# Patient Record
Sex: Male | Born: 1995 | Race: White | Hispanic: No | Marital: Single | Smoking: Current some day smoker
Health system: Southern US, Community
[De-identification: ages and names within clinical notes are randomized; demographics above are authoritative.]

## PROBLEM LIST (undated history)

## (undated) DIAGNOSIS — G809 Cerebral palsy, unspecified: Secondary | ICD-10-CM

## (undated) HISTORY — PX: LEG SURGERY: SHX1003

---
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
## 2019-07-12 ENCOUNTER — Ambulatory Visit: Payer: Self-pay | Admitting: Physician Assistant

## 2019-07-12 ENCOUNTER — Other Ambulatory Visit: Payer: Self-pay

## 2019-07-12 DIAGNOSIS — Z202 Contact with and (suspected) exposure to infections with a predominantly sexual mode of transmission: Secondary | ICD-10-CM

## 2019-07-12 DIAGNOSIS — Z113 Encounter for screening for infections with a predominantly sexual mode of transmission: Secondary | ICD-10-CM

## 2019-07-13 ENCOUNTER — Encounter: Payer: Self-pay | Admitting: Physician Assistant

## 2019-07-13 MED ORDER — ACYCLOVIR 800 MG PO TABS: 800.0000 mg | ORAL_TABLET | Freq: Every day | ORAL | 12 refills | Status: DC

## 2019-07-13 NOTE — Progress Notes (Signed)
    STI clinic/screening visit  Subjective:  Kenneth Richardson is a 23 y.o. male being seen today for an STI screening visit. The patient reports they do have symptoms.  Patient has the following medical conditions:  There are no active problems to display for this patient.    No chief complaint on file.   HPI  Patient reports that he had a "rash and bumps" come up last night.  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  Declines blood work today.   See flowsheet for further details and programmatic requirements.    The following portions of the patient's history were reviewed and updated as appropriate: allergies, current medications, past medical history, past social history, past surgical history and problem list.  Objective:  There were no vitals filed for this visit.  Physical Exam Constitutional:      Appearance: Normal appearance.  HENT:     Head: Normocephalic and atraumatic.     Mouth/Throat:     Mouth: Mucous membranes are moist.     Pharynx: Oropharynx is clear. No oropharyngeal exudate or posterior oropharyngeal erythema.  Eyes:     Conjunctiva/sclera: Conjunctivae normal.  Neck:     Musculoskeletal: Neck supple.  Pulmonary:     Effort: Pulmonary effort is normal.  Abdominal:     Palpations: Abdomen is soft. There is no mass.     Tenderness: There is no abdominal tenderness. There is no guarding or rebound.  Genitourinary:    Penis: Normal.      Scrotum/Testes: Normal.     Comments: Pubic area without nits, lice, edema and inguinal adenopathy.  Patient with scattered erythema and partially healed shallow, ulcerative lesions, nt. Penis circumcised and without discharge at meatus. Lymphadenopathy:     Cervical: No cervical adenopathy.  Skin:    General: Skin is warm and dry.     Findings: No bruising, erythema or rash.  Neurological:     Mental Status: He is alert and oriented to person,  place, and time.  Psychiatric:        Mood and Affect: Mood normal.        Behavior: Behavior normal.        Thought Content: Thought content normal.        Judgment: Judgment normal.       Assessment and Plan:  Kenneth Richardson is a 23 y.o. male presenting to the [facility] Department for STI screening  1. Screening for STD (sexually transmitted disease) Patient declines blood work today. Rec condoms with all sex Await test results.  Counseled that RN will call if needs to RTC once results are back. - Virology, [facility]  2. 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 Rx for Acyclovir 800mg  #30 1 po QD with refills for 1 yr written and given to patient today. Counseled patient to call with other questions or concerns.  - acyclovir (ZOVIRAX) 800 MG tablet; Take 1 tablet (800 mg total) by mouth daily.  Dispense: 30 tablet; Refill: 12     No follow-ups on file.  No future appointments.  Jerene Dilling, PA

## 2019-07-21 ENCOUNTER — Telehealth: Payer: Self-pay

## 2019-07-21 NOTE — Telephone Encounter (Signed)
TC to patient. Verified ID via password/SS#. Informed of positive HSV 2. Discussed disease process. Patient currently feeling better and is taking daily Acyclovir. Patient to call back if any questions or concerns. Aileen Fass, RN

## 2022-10-20 ENCOUNTER — Emergency Department (HOSPITAL_COMMUNITY)
Admission: EM | Admit: 2022-10-20 | Discharge: 2022-10-20 | Disposition: A | Discharge status: Home / Self Care | Payer: Worker's Compensation | Attending: Emergency Medicine | Admitting: Emergency Medicine

## 2022-10-20 ENCOUNTER — Emergency Department (HOSPITAL_COMMUNITY): Payer: Worker's Compensation

## 2022-10-20 DIAGNOSIS — M79642 Pain in left hand: Secondary | ICD-10-CM | POA: Insufficient documentation

## 2022-10-20 DIAGNOSIS — X503XXA Overexertion from repetitive movements, initial encounter: Secondary | ICD-10-CM | POA: Diagnosis not present

## 2022-10-20 DIAGNOSIS — Y990 Civilian activity done for income or pay: Secondary | ICD-10-CM | POA: Insufficient documentation

## 2022-10-20 DIAGNOSIS — M25532 Pain in left wrist: Secondary | ICD-10-CM | POA: Insufficient documentation

## 2022-10-20 DIAGNOSIS — M79643 Pain in unspecified hand: Secondary | ICD-10-CM

## 2022-10-20 DIAGNOSIS — S63502A Unspecified sprain of left wrist, initial encounter: Secondary | ICD-10-CM

## 2022-10-20 MED ORDER — NAPROXEN 500 MG PO TABS: 500.0000 mg | ORAL_TABLET | Freq: Two times a day (BID) | ORAL | 0 refills | Status: DC

## 2022-10-20 NOTE — ED Provider Notes (Signed)
[facility] EMERGENCY DEPARTMENT Provider Note   CSN: [id number] Arrival date & time: 10/20/22  1837     History  Chief Complaint  Patient presents with   Arm Injury    left    Kenneth Richardson is a 26 y.o. male. Right hand dominant otherwise healthy complaining of left wrist and hand pain since using a hand drill that torqued his wrist in abnormal movement about 4 days prior.  Patient complaining of initial pain and swelling that started immediately after torquing his recent abnormal motion on Friday at work.  He developed significant swelling at his wrist that improved with icing.  However he still complaining of pain in his left palm underneath his thumb that shoots down to his elbow.  It hurts more with holding his phone or keeping his hand in certain positions for extended period of time.  No loss of sensation, no paresthesias, no fevers, no erythema or skin changes.   Arm Injury      Home Medications Prior to Admission medications   Medication Sig Start Date End Date Taking? Authorizing Provider  acyclovir (ZOVIRAX) 800 MG tablet Take 1 tablet (800 mg total) by mouth daily. 07/13/19   Matt Holmes, PA      Allergies    Patient has no known allergies.    Review of Systems   Review of Systems  Physical Exam Updated Vital Signs BP (!) 150/87 (BP Location: Right Arm)   Pulse 75   Temp 98.7 F (37.1 C) (Oral)   Resp 16   Ht 5\' 10"  (1.778 m)   Wt 95.3 kg   SpO2 98%   BMI 30.13 kg/m  Physical Exam Constitutional: Alert and oriented. Well appearing and in no distress. Eyes: Conjunctivae are normal. ENT      Head: Normocephalic and atraumatic. Cardiovascular: S1, S2, RRR.Warm and well perfused. Respiratory: Normal respiratory effort.  Gastrointestinal: Nondistended Musculoskeletal: Palpable left radial pulse. Warm and well perfused. Left lateral wrist tenderness to palpation with no associated erythema, warmth or edema.  Left-sided snuffbox  tenderness to palpation.  No pain or deformity proximal to left wrist.  Full grip strength intact.  Sensation grossly intact throughout left hand and fingers.  Okay sign intact, interosseous muscles intact, scissoring of fingers intact. Left wrist ROM intact. Left hand neurovascularly intact.  Neurologic: Normal speech and language. No gross focal neurologic deficits are appreciated. Skin: Skin is warm, dry and intact. No rash noted. Psychiatric: Mood and affect are normal. Speech and behavior are normal.  ED Results / Procedures / Treatments   Labs (all labs ordered are listed, but only abnormal results are displayed) Labs Reviewed - No data to display  EKG None  Radiology DG Hand Complete Left  Result Date: 10/20/2022 CLINICAL DATA:  Injury. EXAM: LEFT HAND - COMPLETE 3+ VIEW; LEFT WRIST - COMPLETE 3+ VIEW COMPARISON:  None Available. FINDINGS: Left wrist: There is no evidence of fracture or dislocation. There is no evidence of arthropathy or other focal bone abnormality. Soft tissues are unremarkable. Left hand: There is no evidence of fracture or dislocation. There is no evidence of arthropathy or other focal bone abnormality. Soft tissues are unremarkable. IMPRESSION: Negative radiographs of the left wrist and left hand. Electronically Signed   By: 14/02/2022 M.D.   On: 10/20/2022 20:50   DG Wrist Complete Left  Result Date: 10/20/2022 CLINICAL DATA:  Injury. EXAM: LEFT HAND - COMPLETE 3+ VIEW; LEFT WRIST - COMPLETE 3+ VIEW COMPARISON:  None Available. FINDINGS: Left wrist: There is no evidence of fracture or dislocation. There is no evidence of arthropathy or other focal bone abnormality. Soft tissues are unremarkable. Left hand: There is no evidence of fracture or dislocation. There is no evidence of arthropathy or other focal bone abnormality. Soft tissues are unremarkable. IMPRESSION: Negative radiographs of the left wrist and left hand. Electronically Signed   By: Darliss Cheney M.D.    On: 10/20/2022 20:50    Procedures Procedures    Medications Ordered in ED Medications - No data to display  ED Course/ Medical Decision Making/ A&P                           Medical Decision Making Kenneth Richardson is a 26 y.o. male. Right hand dominant otherwise healthy complaining of left wrist and hand pain since using a hand drill that torqued his wrist in abnormal movement about 4 days prior.    Patient had x-rays performed of left hand and left wrist which I personally reviewed showed no acute fracture or dislocation.  Since he has snuffbox tenderness will cover with left thumb spica splint.  Suspect likely left wrist sprain.  He is neurovascularly intact.  Will place patient in thumb spica splint, refer to hand surgery for outpatient follow-up and prescribe Naprosyn for pain control.  Also discussed RICE method.  Patient safe for discharge.    Final Clinical Impression(s) / ED Diagnoses Final diagnoses:  None    Rx / DC Orders ED Discharge Orders     None         Mardene Sayer, MD 10/20/22 [phone]

## 2022-10-20 NOTE — ED Triage Notes (Addendum)
Pt to ED c/o left arm injury, Reports was at work on Friday using equipment, that jerked and arm has been hurting ever since, able to move arm in certain positions, but limited ROM. Swelling noted

## 2022-10-20 NOTE — Discharge Instructions (Addendum)
You were seen in the Emergency Department for left wrist pain. Your x-ray did not show any signs of an acute fracture or dislocation. Your pain and swelling is likely from a sprain. However, we are covering for occult scaphoid fracture. Take naproxen, as directed on the packaging, to reduce pain and inflammation at home.   To take care of your wrist while you heal,  R- rest injury as needed, do not overexert yourself I - ice the affected area, 20 minutes every 4-6 hours, this will help with any swelling C- compress the affected area as needed to apply support and to decrease any swelling E- elevate frequently when in sitting or laying position.   Continue to wear the splint whenever not showering and until you make an appointment with the hand surgeon and follow up with hand surgeon.  Return to the Emergency Department if you develop worsening pain, swelling, redness, warmth, numbness, loss of sensation, fever, chills, vomiting, or any other symptoms you find concerning.

## 2022-10-20 NOTE — ED Provider Triage Note (Signed)
Emergency Medicine Provider Triage Evaluation Note  Kenneth Richardson , a 26 y.o. male  was evaluated in triage.  Pt complains of right hand and wrist pain.  Patient states that he was at work on Friday using a drill.  He states that the drill torqued and rotated his hand causing immediate pain.  He states that he initially had some more severe swelling that went down with ice but is continue to have pain and reswelling of the wrist.  He is also having snuffbox tenderness..  States that he is having trouble holding his phone or keeping his hand in certain positions for any extended period of time  Review of Systems  Positive: See above Negative:   Physical Exam  BP (!) 150/87 (BP Location: Right Arm)   Pulse 75   Temp 98.7 F (37.1 C) (Oral)   Resp 16   Ht 5\' 10"  (1.778 m)   Wt 95.3 kg   SpO2 98%   BMI 30.13 kg/m  Gen:   Awake, no distress     Resp:  Normal effort  MSK:   Able to oppose the thumb, give a thumbs up, flex and extend the wrist and fingers.  Tenderness to palpation over the anatomical snuffbox.  He does have some mild swelling over the radial side of the wrist.  Radial pulse 2+.  Sensation intact. Other:    Medical Decision Making  Medically screening exam initiated at 8:16 PM.  Appropriate orders placed.  Kenneth Richardson was informed that the remainder of the evaluation will be completed by another provider, this initial triage assessment does not replace that evaluation, and the importance of remaining in the ED until their evaluation is complete.     Jorja Loa, PA-C 10/20/22 2018

## 2022-10-20 NOTE — ED Notes (Signed)
Pt d/c home per MD order. Discharge summary reviewed, pt verbalizes understanding. Ambulatory off unit. No s/s of acute distress noted at discharge. Discharged home with visitor.

## 2023-04-03 ENCOUNTER — Encounter (HOSPITAL_COMMUNITY): Payer: Self-pay

## 2023-04-03 ENCOUNTER — Ambulatory Visit (HOSPITAL_COMMUNITY): Payer: Self-pay

## 2023-04-03 ENCOUNTER — Ambulatory Visit (HOSPITAL_COMMUNITY)
Admission: RE | Admit: 2023-04-03 | Discharge: 2023-04-03 | Disposition: A | Discharge status: Home / Self Care | Payer: Self-pay | Source: Ambulatory Visit | Attending: Internal Medicine | Admitting: Internal Medicine

## 2023-04-03 VITALS — BP 146/78 | HR 73 | Temp 98.3°F | Resp 18 | Ht 70.0 in | Wt 210.0 lb

## 2023-04-03 DIAGNOSIS — M5432 Sciatica, left side: Secondary | ICD-10-CM

## 2023-04-03 HISTORY — DX: Cerebral palsy, unspecified: G80.9

## 2023-04-03 MED ORDER — METHOCARBAMOL 500 MG PO TABS: 500.0000 mg | ORAL_TABLET | Freq: Every evening | ORAL | 0 refills | Status: AC | PRN

## 2023-04-03 MED ORDER — IBUPROFEN 600 MG PO TABS: 600.0000 mg | ORAL_TABLET | Freq: Four times a day (QID) | ORAL | 0 refills | Status: AC | PRN

## 2023-04-03 NOTE — ED Triage Notes (Signed)
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 Onset yesterday. Back was painful, tight, and reduced range of motion. Pain going down into the left and right legs.   Pain improving today.

## 2023-04-03 NOTE — Discharge Instructions (Addendum)
Rest the affected painful area.   Eating pad use only 20 minutes on-20 minutes off cycle a few times a day Gentle stretching exercises as the pain recedes Please take medications as directed Please do not drive or operate heavy machinery after taking muscle relaxants Please return to urgent care if symptoms persist or worsens.

## 2023-04-03 NOTE — ED Notes (Signed)
Oral drug screen completed and submitted to the correct facilities. Patient given copy of drug screen paperwork.

## 2023-04-03 NOTE — ED Provider Notes (Signed)
[facility]    CSN: [id number] Arrival date & time: 04/03/23  1757      History   Chief Complaint Chief Complaint  Patient presents with   Work Related Injury   Back Pain    HPI Kenneth Richardson is a 27 y.o. 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  He lifted a metal pipe to place it on top of a stack of pipes and ended up twisting his back as a result of that.  Pain is sharp and of moderate severity.  Pain radiates to the left leg.  Patient denies any new weakness in the lower extremities.  No numbness or tingling of the left lower extremity.  Patient denies any relieving factors.  He has not tried any over-the-counter medications to help with his symptoms.  Patient denies any back stiffness or muscle spasms.  No bruising over the lower back.  No urinary or bowel problems.  HPI  Past Medical History:  Diagnosis Date   Cerebral palsy (HCC)     There are no problems to display for this patient.   Past Surgical History:  Procedure Laterality Date   LEG SURGERY Left        Home Medications    Prior to Admission medications   Medication Sig Start Date End Date Taking? Authorizing Provider  ibuprofen (ADVIL) 600 MG tablet Take 1 tablet (600 mg total) by mouth every 6 (six) hours as needed. 04/03/23  Yes Mayola Mcbain, Britta Mccreedy, MD  methocarbamol (ROBAXIN) 500 MG tablet Take 1 tablet (500 mg total) by mouth at bedtime as needed for muscle spasms. 04/03/23  Yes Cheral Cappucci, Britta Mccreedy, MD    Family History History reviewed. No pertinent family history.  Social History Social History   Tobacco Use   Smoking status: Some Days    Types: Cigars   Smokeless tobacco: Never  Vaping Use   Vaping Use: Never used  Substance Use Topics   Alcohol use: Never   Drug use: Not Currently    Types: Marijuana     Allergies   Patient has no known allergies.   Review of Systems Review of Systems As  per HPI  Physical Exam Triage Vital Signs ED Triage Vitals  Enc Vitals Group     BP 04/03/23 1814 (!) 146/78     Pulse Rate 04/03/23 1814 73     Resp 04/03/23 1814 18     Temp 04/03/23 1814 98.3 F (36.8 C)     Temp Source 04/03/23 1814 Oral     SpO2 04/03/23 1814 96 %     Weight 04/03/23 1813 210 lb (95.3 kg)     Height 04/03/23 1813 5\' 10"  (1.778 m)     Head Circumference --      Peak Flow --      Pain Score 04/03/23 1812 4     Pain Loc --      Pain Edu? --      Excl. in GC? --    No data found.  Updated Vital Signs BP (!) 146/78 (BP Location: Left Arm)   Pulse 73   Temp 98.3 F (36.8 C) (Oral)   Resp 18   Ht 5\' 10"  (1.778 m)   Wt 95.3 kg   SpO2 96%   BMI 30.13 kg/m   Visual Acuity Right Eye Distance:   Left Eye Distance:   Bilateral Distance:    Right Eye Near:  Left Eye Near:    Bilateral Near:     Physical Exam Vitals and nursing note reviewed.  Constitutional:      General: He is not in acute distress.    Appearance: He is not ill-appearing.  Cardiovascular:     Rate and Rhythm: Normal rate and regular rhythm.  Pulmonary:     Effort: Pulmonary effort is normal.     Breath sounds: Normal breath sounds.  Musculoskeletal:        General: Normal range of motion.     Comments: Tenderness over the left paraspinal muscle in the thoracolumbar region.  Power is 5/5 in both lower extremities.  Deep tendon reflexes are 2+.  Neurological:     Mental Status: He is alert.      UC Treatments / Results  Labs (all labs ordered are listed, but only abnormal results are displayed) Labs Reviewed - No data to display  EKG   Radiology No results found.  Procedures Procedures (including critical care time)  Medications Ordered in UC Medications - No data to display  Initial Impression / Assessment and Plan / UC Course  I have reviewed the triage vital signs and the nursing notes.  Pertinent labs & imaging results that were available during my care  of the patient were reviewed by me and considered in my medical decision making (see chart for details).     1.  Left-sided sciatica: Robaxin 500 mg at bedtime as needed for muscle spasms or muscle tightness Ibuprofen 600 mg every 6 hours as needed for pain Gentle range of motion exercises Heating pad use only 20 minutes on-20 minutes off cycle No indication for imaging at this time Return precautions given. Final Clinical Impressions(s) / UC Diagnoses   Final diagnoses:  Left sided sciatica     Discharge Instructions      Rest the affected painful area.   Eating pad use only 20 minutes on-20 minutes off cycle a few times a day Gentle stretching exercises as the pain recedes Please take medications as directed Please do not drive or operate heavy machinery after taking muscle relaxants Please return to urgent care if symptoms persist or worsens.     ED Prescriptions     Medication Sig Dispense Auth. Provider   methocarbamol (ROBAXIN) 500 MG tablet Take 1 tablet (500 mg total) by mouth at bedtime as needed for muscle spasms. 10 tablet Taneil Lazarus, Britta Mccreedy, MD   ibuprofen (ADVIL) 600 MG tablet Take 1 tablet (600 mg total) by mouth every 6 (six) hours as needed. 30 tablet Ziva Nunziata, Britta Mccreedy, MD      PDMP not reviewed this encounter.   Merrilee Jansky, MD 04/03/23 [phone]

## 2023-11-01 ENCOUNTER — Emergency Department (HOSPITAL_COMMUNITY): Payer: Self-pay

## 2023-11-01 ENCOUNTER — Other Ambulatory Visit: Payer: Self-pay

## 2023-11-01 ENCOUNTER — Emergency Department (HOSPITAL_COMMUNITY)
Admission: EM | Admit: 2023-11-01 | Discharge: 2023-11-01 | Disposition: A | Discharge status: Home / Self Care | Payer: Self-pay | Attending: Student | Admitting: Student

## 2023-11-01 ENCOUNTER — Encounter (HOSPITAL_COMMUNITY): Payer: Self-pay

## 2023-11-01 DIAGNOSIS — W19XXXA Unspecified fall, initial encounter: Secondary | ICD-10-CM

## 2023-11-01 DIAGNOSIS — M25561 Pain in right knee: Secondary | ICD-10-CM | POA: Insufficient documentation

## 2023-11-01 DIAGNOSIS — S299XXA Unspecified injury of thorax, initial encounter: Secondary | ICD-10-CM | POA: Insufficient documentation

## 2023-11-01 DIAGNOSIS — M51369 Other intervertebral disc degeneration, lumbar region without mention of lumbar back pain or lower extremity pain: Secondary | ICD-10-CM

## 2023-11-01 DIAGNOSIS — M5126 Other intervertebral disc displacement, lumbar region: Secondary | ICD-10-CM | POA: Insufficient documentation

## 2023-11-01 DIAGNOSIS — M79642 Pain in left hand: Secondary | ICD-10-CM | POA: Insufficient documentation

## 2023-11-01 DIAGNOSIS — W138XXA Fall from, out of or through other building or structure, initial encounter: Secondary | ICD-10-CM | POA: Insufficient documentation

## 2023-11-01 DIAGNOSIS — M25522 Pain in left elbow: Secondary | ICD-10-CM | POA: Insufficient documentation

## 2023-11-01 DIAGNOSIS — S0990XA Unspecified injury of head, initial encounter: Secondary | ICD-10-CM | POA: Insufficient documentation

## 2023-11-01 DIAGNOSIS — S3991XA Unspecified injury of abdomen, initial encounter: Secondary | ICD-10-CM | POA: Insufficient documentation

## 2023-11-01 DIAGNOSIS — Y9339 Activity, other involving climbing, rappelling and jumping off: Secondary | ICD-10-CM | POA: Insufficient documentation

## 2023-11-01 DIAGNOSIS — M25512 Pain in left shoulder: Secondary | ICD-10-CM | POA: Insufficient documentation

## 2023-11-01 LAB — CBC WITH DIFFERENTIAL/PLATELET
Abs Immature Granulocytes: 0.03 10*3/uL (ref 0.00–0.07)
Basophils Absolute: 0.1 10*3/uL (ref 0.0–0.1)
Basophils Relative: 1 %
Eosinophils Absolute: 0.2 10*3/uL (ref 0.0–0.5)
Eosinophils Relative: 2 %
HCT: 46.1 % (ref 39.0–52.0)
Hemoglobin: 15.5 g/dL (ref 13.0–17.0)
Immature Granulocytes: 0 %
Lymphocytes Relative: 25 %
Lymphs Abs: 2.7 10*3/uL (ref 0.7–4.0)
MCH: 29.6 pg (ref 26.0–34.0)
MCHC: 33.6 g/dL (ref 30.0–36.0)
MCV: 88.1 fL (ref 80.0–100.0)
Monocytes Absolute: 0.9 10*3/uL (ref 0.1–1.0)
Monocytes Relative: 8 %
Neutro Abs: 7 10*3/uL (ref 1.7–7.7)
Neutrophils Relative %: 64 %
Platelets: 186 10*3/uL (ref 150–400)
RBC: 5.23 MIL/uL (ref 4.22–5.81)
RDW: 11.6 % (ref 11.5–15.5)
WBC: 10.8 10*3/uL — ABNORMAL HIGH (ref 4.0–10.5)
nRBC: 0 % (ref 0.0–0.2)

## 2023-11-01 LAB — I-STAT CHEM 8, ED
BUN: 7 mg/dL (ref 6–20)
Calcium, Ion: 1.11 mmol/L — ABNORMAL LOW (ref 1.15–1.40)
Chloride: 105 mmol/L (ref 98–111)
Creatinine, Ser: 1.1 mg/dL (ref 0.61–1.24)
Glucose, Bld: 94 mg/dL (ref 70–99)
HCT: 45 % (ref 39.0–52.0)
Hemoglobin: 15.3 g/dL (ref 13.0–17.0)
Potassium: 3.5 mmol/L (ref 3.5–5.1)
Sodium: 140 mmol/L (ref 135–145)
TCO2: 22 mmol/L (ref 22–32)

## 2023-11-01 LAB — BASIC METABOLIC PANEL
Anion gap: 10 (ref 5–15)
BUN: 9 mg/dL (ref 6–20)
CO2: 21 mmol/L — ABNORMAL LOW (ref 22–32)
Calcium: 8.9 mg/dL (ref 8.9–10.3)
Chloride: 107 mmol/L (ref 98–111)
Creatinine, Ser: 1.18 mg/dL (ref 0.61–1.24)
GFR, Estimated: >60 mL/min (ref >60)
Glucose, Bld: 95 mg/dL (ref 70–99)
Potassium: 3.7 mmol/L (ref 3.5–5.1)
Sodium: 138 mmol/L (ref 135–145)

## 2023-11-01 MED ORDER — ETODOLAC 400 MG PO TABS: 400.0000 mg | ORAL_TABLET | Freq: Two times a day (BID) | ORAL | 0 refills | Status: AC

## 2023-11-01 MED ORDER — METHYLPREDNISOLONE SODIUM SUCC 125 MG IJ SOLR
125.0000 mg | Freq: Once | INTRAMUSCULAR | Status: AC
  Administered 2023-11-01: 125 mg via INTRAVENOUS
  Filled 2023-11-01: qty 2

## 2023-11-01 MED ORDER — KETOROLAC TROMETHAMINE 15 MG/ML IJ SOLN
15.0000 mg | Freq: Once | INTRAMUSCULAR | Status: AC
  Administered 2023-11-01: 15 mg via INTRAVENOUS
  Filled 2023-11-01: qty 1

## 2023-11-01 MED ORDER — ONDANSETRON HCL 4 MG/2ML IJ SOLN
4.0000 mg | Freq: Once | INTRAMUSCULAR | Status: AC
  Administered 2023-11-01: 4 mg via INTRAVENOUS
  Filled 2023-11-01: qty 2

## 2023-11-01 MED ORDER — FENTANYL CITRATE PF 50 MCG/ML IJ SOSY: 50.0000 ug | PREFILLED_SYRINGE | Freq: Once | INTRAMUSCULAR | Status: DC

## 2023-11-01 MED ORDER — IOHEXOL 350 MG/ML SOLN
75.0000 mL | Freq: Once | INTRAVENOUS | Status: AC | PRN
  Administered 2023-11-01: 75 mL via INTRAVENOUS

## 2023-11-01 MED ORDER — ACETAMINOPHEN 500 MG PO TABS
1000.0000 mg | ORAL_TABLET | Freq: Once | ORAL | Status: AC
  Administered 2023-11-01: 1000 mg via ORAL
  Filled 2023-11-01: qty 2

## 2023-11-01 MED ORDER — PREDNISONE 10 MG (21) PO TBPK: ORAL_TABLET | Freq: Every day | ORAL | 0 refills | Status: AC

## 2023-11-01 NOTE — Discharge Instructions (Signed)
Your workup today was overall reassuring with the exception of the nasal bones noted at L5-S1 level on the left side.  Neurosurgery referral given for this.  Steroids sent into your pharmacy.  Given you received a shot of steroids in the emergency department start this tomorrow.  Also sent an anti-inflammatory medication called Lodine into the pharmacy do not combine this with ibuprofen or Aleve.  You can take 1000 mg of Tylenol every 6 hours in addition to these medications.  Return for any concerning symptoms.  Otherwise follow-up with the providers listed above.  You were given a shoulder sling for your pain in the left shoulder.  No fractures on x-rays.

## 2023-11-01 NOTE — ED Notes (Signed)
Pt. Ambulated with steady even gait

## 2023-11-01 NOTE — ED Notes (Signed)
Ortho notified for shoulder sling

## 2023-11-01 NOTE — Progress Notes (Signed)
Orthopedic Tech Progress Note Patient Details:  Kenneth Richardson December 14, 1995 [id number]  Ortho Devices Type of Ortho Device: Arm sling Ortho Device/Splint Location: lue Ortho Device/Splint Interventions: Ordered, Adjustment, Application   Post Interventions Patient Tolerated: Well Instructions Provided: Care of device, Adjustment of device  [name] Post 11/01/2023, 9:21 PM

## 2023-11-01 NOTE — ED Triage Notes (Signed)
Pt c.o left shoulder pain and right knee pain s.p fall out of a deer stand yesterday, fall was about 20 ft. Stands he landed on his back. Pt does not recall if he passed out or not. 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 Pt also c.o neck pain and states he cannot turn to the left. Pt ambulatory into the ED.

## 2023-11-01 NOTE — ED Notes (Signed)
X-ray at bedside

## 2023-11-01 NOTE — ED Provider Notes (Signed)
[place] EMERGENCY DEPARTMENT AT [facility] Provider Note   CSN: [id number] Arrival date & time: 11/01/23  1404     History  Chief Complaint  Patient presents with   Kenneth Richardson    CAIO KILMER is a 27 y.o. male.  27 year old male presents today for concern of a fall that occurred yesterday.  He fell from a 20 foot height.  He states he was climbing a deer stand.  He states he ensured that the safety harness was in place however the malfunctioned and he fell as he got to the top.  Does endorse loss of consciousness.  States that it took him a while to be able to get back on his feet.  He states he took quite a bit of ibuprofen and stayed in bed.  It was difficult for him to get out of bed this morning due to not being able to move his right leg.  Endorses some difficulty with taking a deep breath.  No other abdominal pain or chest wall pain.  Does endorse left shoulder pain, left elbow pain, left hand pain, right knee pain.  No other joint issues.  Not on anticoagulation.  The history is provided by the patient. No language interpreter was used.       Home Medications Prior to Admission medications   Medication Sig Start Date End Date Taking? Authorizing Provider  ibuprofen (ADVIL) 600 MG tablet Take 1 tablet (600 mg total) by mouth every 6 (six) hours as needed. 04/03/23   Lamptey, Britta Mccreedy, MD  methocarbamol (ROBAXIN) 500 MG tablet Take 1 tablet (500 mg total) by mouth at bedtime as needed for muscle spasms. 04/03/23   Lamptey, Britta Mccreedy, MD      Allergies    Patient has no known allergies.    Review of Systems   Review of Systems  Constitutional:  Negative for fever.  Musculoskeletal:  Positive for arthralgias and joint swelling.  Neurological:  Negative for light-headedness.  All other systems reviewed and are negative.   Physical Exam Updated Vital Signs BP 131/88   Pulse 79   Temp 97.6 F (36.4 C) (Oral)   Resp 19   Ht 5\' 11"  (1.803 m)   Wt 93 kg    SpO2 98%   BMI 28.59 kg/m  Physical Exam Vitals and nursing note reviewed.  Constitutional:      General: He is not in acute distress.    Appearance: Normal appearance. He is not ill-appearing.  HENT:     Head: Normocephalic and atraumatic.     Nose: Nose normal.  Eyes:     Conjunctiva/sclera: Conjunctivae normal.  Cardiovascular:     Rate and Rhythm: Normal rate and regular rhythm.     Heart sounds: Normal heart sounds.  Pulmonary:     Effort: Pulmonary effort is normal. No respiratory distress.     Breath sounds: No wheezing.  Abdominal:     General: There is no distension.     Palpations: Abdomen is soft.     Tenderness: There is no abdominal tenderness. There is no guarding.  Musculoskeletal:        General: No deformity. Normal range of motion.     Cervical back: Normal range of motion.     Comments: C-collar in place.  Thoracic and lumbar spine without tenderness palpation.  Full range motion bilateral upper and lower extremities however if there is pain to the right knee with knee flexion, as well as pain to the  left shoulder with flexion or extension, pain at the left elbow with extension and flexion.  Visible swelling to the proximal left hand.  No significant laceration.  Neurovascularly intact.  Skin:    Findings: No rash.  Neurological:     Mental Status: He is alert.     ED Results / Procedures / Treatments   Labs (all labs ordered are listed, but only abnormal results are displayed) Labs Reviewed  CBC WITH DIFFERENTIAL/PLATELET - Abnormal; Notable for the following components:      Result Value   WBC 10.8 (*)    All other components within normal limits  BASIC METABOLIC PANEL - Abnormal; Notable for the following components:   CO2 21 (*)    All other components within normal limits  I-STAT CHEM 8, ED - Abnormal; Notable for the following components:   Calcium, Ion 1.11 (*)    All other components within normal limits    EKG None  Radiology No  results found.  Procedures Procedures    Medications Ordered in ED Medications  fentaNYL (SUBLIMAZE) injection 50 mcg (50 mcg Intravenous Not Given 11/01/23 1543)  ondansetron (ZOFRAN) injection 4 mg (4 mg Intravenous Given 11/01/23 1549)  acetaminophen (TYLENOL) tablet 1,000 mg (1,000 mg Oral Given 11/01/23 1548)  ketorolac (TORADOL) 15 MG/ML injection 15 mg (15 mg Intravenous Given 11/01/23 1551)    ED Course/ Medical Decision Making/ A&P                                 Medical Decision Making Amount and/or Complexity of Data Reviewed Labs: ordered. Radiology: ordered.  Risk OTC drugs. Prescription drug management.   Medical Decision Making / ED Course   This patient presents to the ED for concern of fall, this involves an extensive number of treatment options, and is a complaint that carries with it a high risk of complications and morbidity.  The differential diagnosis includes head injury, spinal injury, internal injury, multiple traumas, fractures, concussion  MDM: 27 year old male presents today for concern of fall that occurred yesterday.  He fell from 20 foot height.  He did have loss of consciousness.  No associated vision change, current headache, nausea or vomiting.  C-collar in place.  Exam overall reassuring with the exception of joint TTP.  He did endorse some exertional dyspnea/fatigue.  Otherwise no chest pain or abdominal pain.  CBC shows mild leukocytosis, no left shift.  No anemia.  BMP with preserved renal function, normal electrolytes.  CT head, cervical spine without acute intracranial or cervical spinal finding.  CT chest abdomen pelvis with contrast without acute findings.  CT T-spine without acute concern.  CT L-spine showed partially calcified lesion on the left with mass effect pressing on the S1 nerve root.  Pelvic x-ray showed concern for questionable pubic rami fracture however no fracture on CT.  Do not believe this is a fracture.  X-ray of the  right knee, right tib-fib without fracture.  Left shoulder, left elbow, left wrist without acute finding.  Patient able ambulate without difficulty.  Dose of Solu-Medrol given in the ED.  Prednisone prescribed.  Patient does not prefer to take any narcotic pain medicine.  Discussed multimodal pain control with anti-inflammatory and Tylenol.  Return precaution discussed.  Referral to neurosurgery, orthopedics, and internal medicine given.  Patient advised voiced understanding and are in agreement with plan.  Lab Tests: -I ordered, reviewed, and interpreted labs.   The  pertinent results include:   Labs Reviewed  CBC WITH DIFFERENTIAL/PLATELET - Abnormal; Notable for the following components:      Result Value   WBC 10.8 (*)    All other components within normal limits  BASIC METABOLIC PANEL - Abnormal; Notable for the following components:   CO2 21 (*)    All other components within normal limits  I-STAT CHEM 8, ED - Abnormal; Notable for the following components:   Calcium, Ion 1.11 (*)    All other components within normal limits      EKG  EKG Interpretation Date/Time:    Ventricular Rate:    PR Interval:    QRS Duration:    QT Interval:    QTC Calculation:   R Axis:      Text Interpretation:           Imaging Studies ordered: I ordered imaging studies including CT chest abdomen pelvis with contrast, CT head, CT cervical spine, CT T-spine no charge, CT L-spine no charge, chest x-ray, pelvic x-ray, left shoulder, left elbow, left wrist x-rays, right knee x-ray, right tib-fib x-ray I independently visualized and interpreted imaging. I agree with the radiologist interpretation   Medicines ordered and prescription drug management: Meds ordered this encounter  Medications   fentaNYL (SUBLIMAZE) injection 50 mcg   ondansetron (ZOFRAN) injection 4 mg   acetaminophen (TYLENOL) tablet 1,000 mg   ketorolac (TORADOL) 15 MG/ML injection 15 mg   iohexol (OMNIPAQUE) 350 MG/ML  injection 75 mL   methylPREDNISolone sodium succinate (SOLU-MEDROL) 125 mg/2 mL injection 125 mg    IV methylprednisolone will be converted to either a q12h or q24h frequency with the same total daily dose (TDD).  Ordered Dose: 1 to 125 mg TDD; convert to: TDD q24h.  Ordered Dose: 126 to 250 mg TDD; convert to: TDD div q12h.  Ordered Dose: >250 mg TDD; DAW.    -I have reviewed the patients home medicines and have made adjustments as needed  Reevaluation: After the interventions noted above, I reevaluated the patient and found that they have :improved  Co morbidities that complicate the patient evaluation  Past Medical History:  Diagnosis Date   Cerebral palsy (HCC)       Dispostion: Discharged in stable condition.  Return precaution discussed.  Patient voices understanding and is in agreement with plan.   Final Clinical Impression(s) / ED Diagnoses Final diagnoses:  Fall, initial encounter  Bulge of lumbar disc without myelopathy  Acute pain of right knee  Acute pain of left shoulder    Rx / DC Orders ED Discharge Orders          Ordered    etodolac (LODINE) 400 MG tablet  2 times daily        11/01/23 2009    predniSONE (STERAPRED UNI-PAK 21 TAB) 10 MG (21) TBPK tablet  Daily        11/01/23 2009              Marita [place], [place] 11/01/23 2018    Glendora Score, MD 11/02/23 1300
